# Patient Record
Sex: Female | Born: 1980 | Race: Black or African American | Hispanic: No | Marital: Single | Smoking: Never smoker
Health system: Southern US, Community
[De-identification: ages and names within clinical notes are randomized; demographics above are authoritative.]

## PROBLEM LIST (undated history)

## (undated) HISTORY — PX: TUBAL LIGATION: SHX77

---

## 2018-11-10 ENCOUNTER — Encounter (HOSPITAL_COMMUNITY): Payer: Self-pay | Admitting: Emergency Medicine

## 2018-11-10 ENCOUNTER — Emergency Department (HOSPITAL_COMMUNITY)
Admission: EM | Admit: 2018-11-10 | Discharge: 2018-11-11 | Disposition: A | Discharge status: Home / Self Care | Payer: BLUE CROSS/BLUE SHIELD | Attending: Emergency Medicine | Admitting: Emergency Medicine

## 2018-11-10 ENCOUNTER — Other Ambulatory Visit: Payer: Self-pay

## 2018-11-10 DIAGNOSIS — R102 Pelvic and perineal pain: Secondary | ICD-10-CM | POA: Diagnosis not present

## 2018-11-10 DIAGNOSIS — Z113 Encounter for screening for infections with a predominantly sexual mode of transmission: Secondary | ICD-10-CM | POA: Insufficient documentation

## 2018-11-10 DIAGNOSIS — N939 Abnormal uterine and vaginal bleeding, unspecified: Secondary | ICD-10-CM | POA: Insufficient documentation

## 2018-11-10 LAB — CBC WITH DIFFERENTIAL/PLATELET
Abs Immature Granulocytes: 0.01 10*3/uL (ref 0.00–0.07)
Basophils Absolute: 0 10*3/uL (ref 0.0–0.1)
Basophils Relative: 0 %
Eosinophils Absolute: 0.1 10*3/uL (ref 0.0–0.5)
Eosinophils Relative: 2 %
HCT: 35.6 % — ABNORMAL LOW (ref 36.0–46.0)
Hemoglobin: 11.2 g/dL — ABNORMAL LOW (ref 12.0–15.0)
Immature Granulocytes: 0 %
Lymphocytes Relative: 34 %
Lymphs Abs: 2.1 10*3/uL (ref 0.7–4.0)
MCH: 27.8 pg (ref 26.0–34.0)
MCHC: 31.5 g/dL (ref 30.0–36.0)
MCV: 88.3 fL (ref 80.0–100.0)
Monocytes Absolute: 0.4 10*3/uL (ref 0.1–1.0)
Monocytes Relative: 7 %
Neutro Abs: 3.5 10*3/uL (ref 1.7–7.7)
Neutrophils Relative %: 57 %
Platelets: 240 10*3/uL (ref 150–400)
RBC: 4.03 MIL/uL (ref 3.87–5.11)
RDW: 12.6 % (ref 11.5–15.5)
WBC: 6.1 10*3/uL (ref 4.0–10.5)
nRBC: 0 % (ref 0.0–0.2)

## 2018-11-10 LAB — COMPREHENSIVE METABOLIC PANEL
ALT: 11 U/L (ref 0–44)
AST: 14 U/L — ABNORMAL LOW (ref 15–41)
Albumin: 3.4 g/dL — ABNORMAL LOW (ref 3.5–5.0)
Alkaline Phosphatase: 52 U/L (ref 38–126)
Anion gap: 5 (ref 5–15)
BUN: 9 mg/dL (ref 6–20)
CO2: 25 mmol/L (ref 22–32)
Calcium: 8.6 mg/dL — ABNORMAL LOW (ref 8.9–10.3)
Chloride: 108 mmol/L (ref 98–111)
Creatinine, Ser: 0.78 mg/dL (ref 0.44–1.00)
GFR calc Af Amer: >60 mL/min (ref >60)
GFR calc non Af Amer: >60 mL/min (ref >60)
Glucose, Bld: 93 mg/dL (ref 70–99)
Potassium: 3.7 mmol/L (ref 3.5–5.1)
Sodium: 138 mmol/L (ref 135–145)
Total Bilirubin: 0.6 mg/dL (ref 0.3–1.2)
Total Protein: 7.2 g/dL (ref 6.5–8.1)

## 2018-11-10 LAB — I-STAT BETA HCG BLOOD, ED (MC, WL, AP ONLY): I-stat hCG, quantitative: <5 m[IU]/mL (ref <5)

## 2018-11-10 LAB — URINALYSIS, ROUTINE W REFLEX MICROSCOPIC
Bilirubin Urine: NEGATIVE
Glucose, UA: NEGATIVE mg/dL
Ketones, ur: NEGATIVE mg/dL
Leukocytes,Ua: NEGATIVE
Nitrite: NEGATIVE
Protein, ur: NEGATIVE mg/dL
Specific Gravity, Urine: 1.013 (ref 1.005–1.030)
pH: 6 (ref 5.0–8.0)

## 2018-11-10 NOTE — ED Triage Notes (Signed)
Patient reports vaginal spotting and low abdominal tenderness this morning , pt. added mid/low back pain yesterday , denies dysuria or fever .

## 2018-11-11 ENCOUNTER — Emergency Department (HOSPITAL_COMMUNITY): Payer: BLUE CROSS/BLUE SHIELD

## 2018-11-11 LAB — WET PREP, GENITAL
Sperm: NONE SEEN
Trich, Wet Prep: NONE SEEN
Yeast Wet Prep HPF POC: NONE SEEN

## 2018-11-11 MED ORDER — IBUPROFEN 800 MG PO TABS: 800.0000 mg | ORAL_TABLET | Freq: Once | ORAL | Status: DC

## 2018-11-11 NOTE — ED Provider Notes (Signed)
TIME SEEN: 2:01 AM  CHIEF COMPLAINT: Vaginal bleeding, lower abdominal pain  HPI: Patient is a 38 year old G6, P5 who presents to the emergency department with several days of lower back pain and then lower abdominal pain that started this morning with spotting that has now turned into increased vaginal bleeding.  States she passed something in the bathroom that she states look like "flesh".  She states she has had 1 previous miscarriage after a BTL.  She was concerned she could be pregnant today.  She is sexually active with one female partner for the past 10 months.  Does not use protection.  No history of STDs.  No abnormal vaginal discharge.  No dysuria or hematuria.  Denies fevers, chills, nausea, vomiting or diarrhea.  ROS: See HPI Constitutional: no fever  Eyes: no drainage  ENT: no runny nose   Cardiovascular:  no chest pain  Resp: no SOB  GI: no vomiting GU: no dysuria Integumentary: no rash  Allergy: no hives  Musculoskeletal: no leg swelling  Neurological: no slurred speech ROS otherwise negative  PAST MEDICAL HISTORY/PAST SURGICAL HISTORY:  History reviewed. No pertinent past medical history.  MEDICATIONS:  Prior to Admission medications   Not on File    ALLERGIES:  No Known Allergies  SOCIAL HISTORY:  Social History   Tobacco Use  . Smoking status: Never Smoker  . Smokeless tobacco: Never Used  Substance Use Topics  . Alcohol use: Never    Frequency: Never    FAMILY HISTORY: No family history on file.  EXAM: BP 116/87 (BP Location: Right Arm)   Pulse (!) 59   Temp 98.5 F (36.9 C) (Oral)   Resp 17   LMP 10/18/2018   SpO2 98%  CONSTITUTIONAL: Alert and oriented and responds appropriately to questions. Well-appearing; well-nourished HEAD: Normocephalic EYES: Conjunctivae clear, pupils appear equal, EOMI ENT: normal nose; moist mucous membranes NECK: Supple, no meningismus, no nuchal rigidity, no LAD  CARD: RRR; S1 and S2 appreciated; no murmurs, no  clicks, no rubs, no gallops RESP: Normal chest excursion without splinting or tachypnea; breath sounds clear and equal bilaterally; no wheezes, no rhonchi, no rales, no hypoxia or respiratory distress, speaking full sentences ABD/GI: Normal bowel sounds; non-distended; soft, tender diffusely throughout the pelvic region, no rebound, no guarding, no peritoneal signs, no hepatosplenomegaly GU:  Normal external genitalia. No lesions, rashes noted. Patient has small amount of dark red vaginal bleeding on exam. No vaginal discharge.  She has bilateral adnexal tenderness but no mass or fullness, no cervical motion tenderness. Cervix is not appear friable.  Cervix is closed.  Chaperone present for exam. BACK:  The back appears normal and is non-tender to palpation, there is no CVA tenderness EXT: Normal ROM in all joints; non-tender to palpation; no edema; normal capillary refill; no cyanosis, no calf tenderness or swelling    SKIN: Normal color for age and race; warm; no rash NEURO: Moves all extremities equally PSYCH: The patient's mood and manner are appropriate. Grooming and personal hygiene are appropriate.  MEDICAL DECISION MAKING: Patient here with pelvic pain.  Differential includes dysmenorrhea, endometriosis, TOA, torsion.  Pregnancy test is negative.  Urine shows no sign of infection.  Will check for STDs today.  Will obtain transvaginal ultrasound.  She states she would like ibuprofen for pain.  Doubt appendicitis, diverticulitis, colitis, bowel obstruction, perforation, cholecystitis, pancreatitis.  ED PROGRESS: Patient's wet prep shows clue cells but she is not having any discharge, itching or burning.  I do not feel  this needs to be treated at this time.  Otherwise wet prep is unremarkable.  Endovaginal ultrasound normal.  Normal blood flow to both ovaries.  No cyst, fibroids or other abnormality.  I suspect that her bleeding and discomfort are from her menstrual cycle starting a little bit  earlier this month than normal.  She agrees.  Recommended alternating Tylenol and Motrin.  Discussed return precautions.   At this time, I do not feel there is any life-threatening condition present. I have reviewed and discussed all results (EKG, imaging, lab, urine as appropriate) and exam findings with patient/family. I have reviewed nursing notes and appropriate previous records.  I feel the patient is safe to be discharged home without further emergent workup and can continue workup as an outpatient as needed. Discussed usual and customary return precautions. Patient/family verbalize understanding and are comfortable with this plan.  Outpatient follow-up has been provided as needed. All questions have been answered.      Dyshaun Bonzo, Delice Bison, DO 11/11/18 [id number]

## 2018-11-11 NOTE — ED Notes (Signed)
Patient transported to Ultrasound 

## 2018-11-11 NOTE — Discharge Instructions (Addendum)
You may alternate Tylenol 1000 mg every 6 hours as needed for pain and Ibuprofen 800 mg every 8 hours as needed for pain.  Please take Ibuprofen with food.  Please follow-up with your OB/GYN in [place] if symptoms continue.  Your labs, urine, ultrasound today were normal.  Gonorrhea and Chlamydia testing is pending.  If you are positive, you will be contacted.  You may follow-up on your results in my chart.

## 2018-11-14 LAB — GC/CHLAMYDIA PROBE AMP (~~LOC~~) NOT AT ARMC
Chlamydia: NEGATIVE
Neisseria Gonorrhea: NEGATIVE

## 2020-07-04 IMAGING — US ARTERIAL AND VENOUS ULTRASOUND OF THE ABDOMEN PELVIS AND SCROTUM
1 series · 13 of 25 positions shown · non-contrast
Comparison: None.

CLINICAL DATA: Abdominal pain.  Abnormal bleeding.

EXAM:
TRANSABDOMINAL AND TRANSVAGINAL ULTRASOUND OF PELVIS
DOPPLER ULTRASOUND OF OVARIES
TECHNIQUE: Both transabdominal and transvaginal ultrasound examinations of the
pelvis were performed. Transabdominal technique was performed for
global imaging of the pelvis including uterus, ovaries, adnexal
regions, and pelvic cul-de-sac.
It was necessary to proceed with endovaginal exam following the
transabdominal exam to visualize the ovaries. Color and duplex
Doppler ultrasound was utilized to evaluate blood flow to the
ovaries.

[Series 1: arterial and venous ultrasound of the abdomen pelv · 13 of 72 slices shown]
[im 1/72]
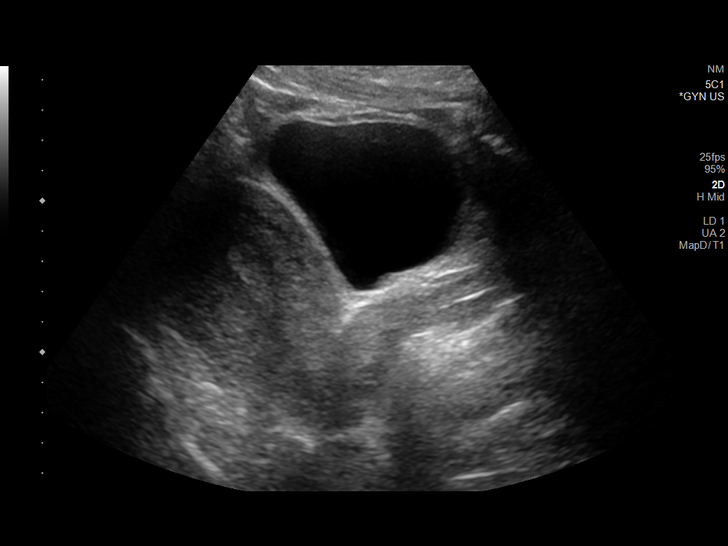
[im 6/72]
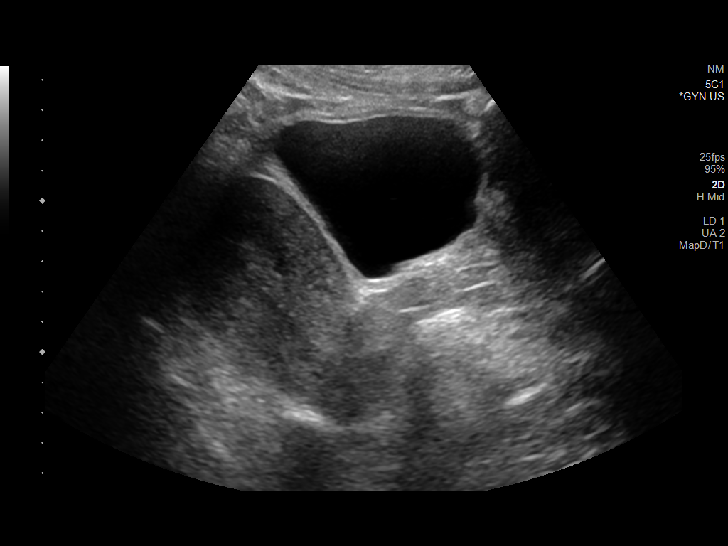
[im 12/72]
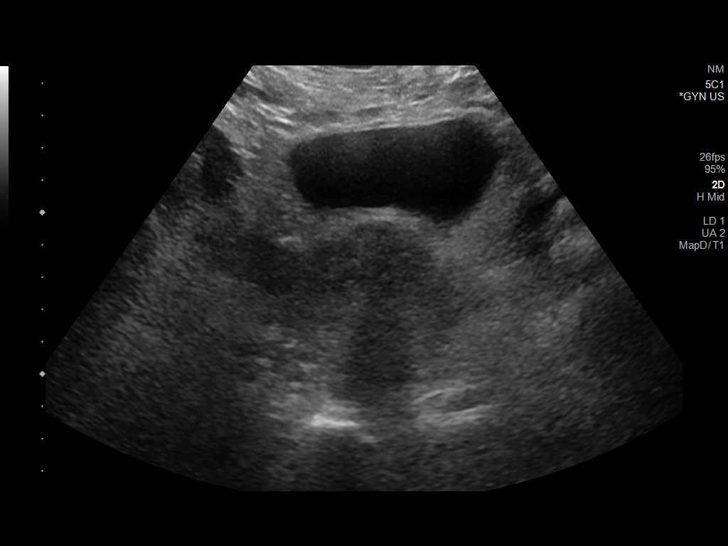
[im 18/72]
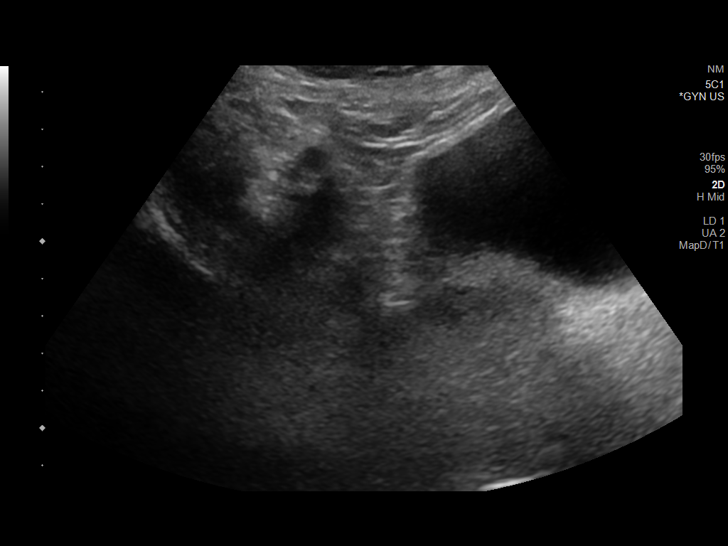
[im 24/72]
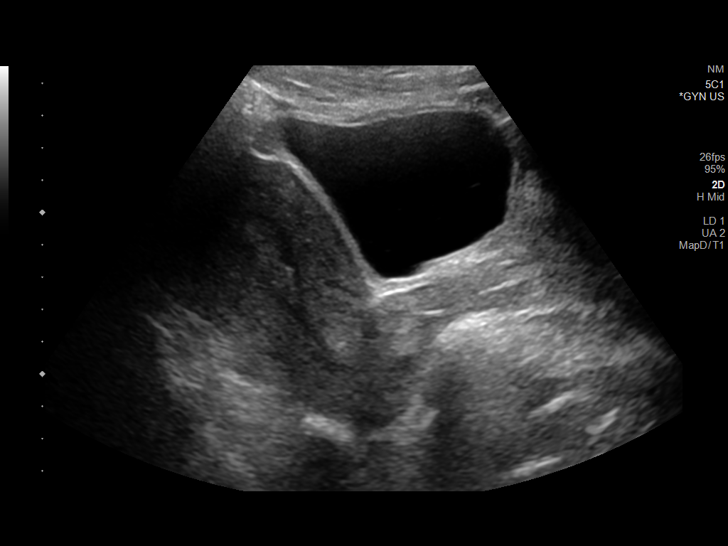
[im 30/72]
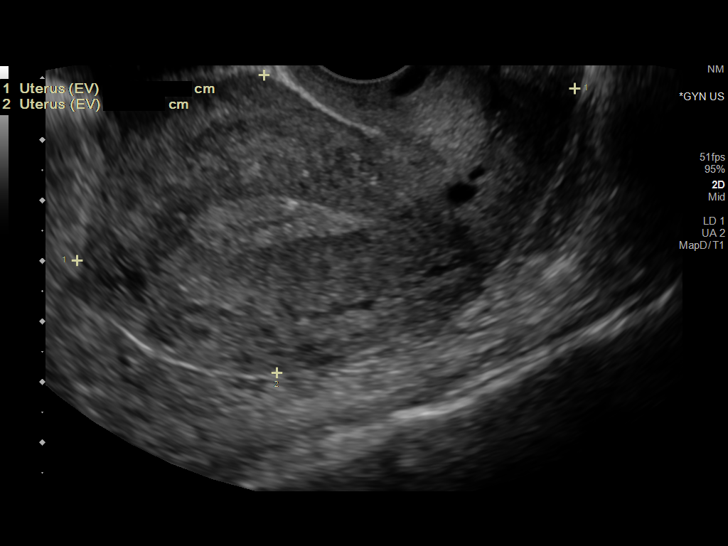
[im 36/72]
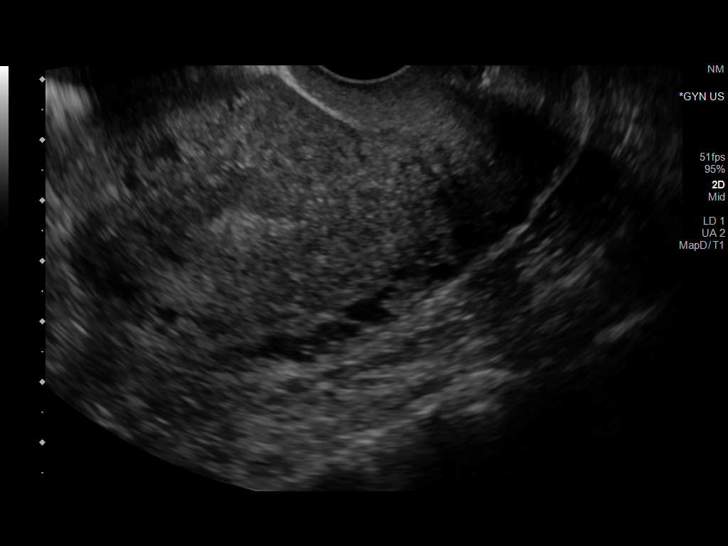
[im 42/72]
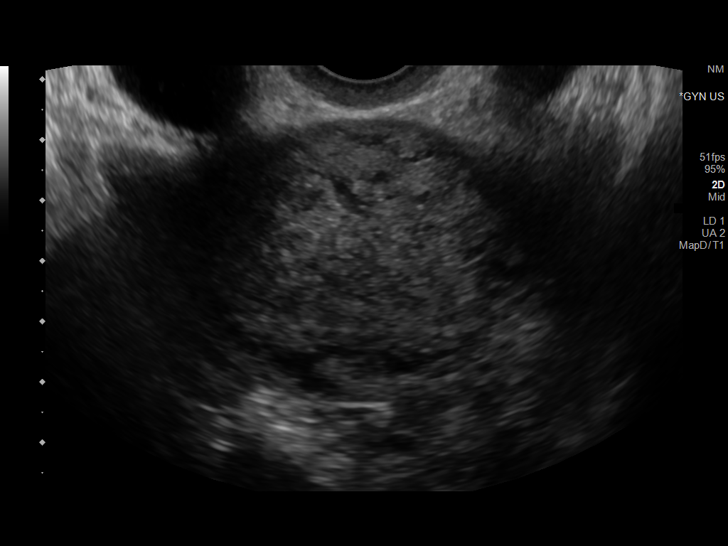
[im 48/72]
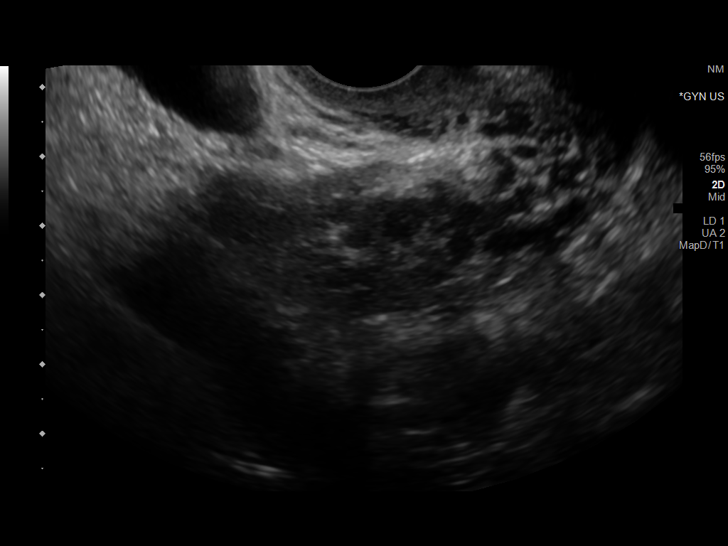
[im 54/72]
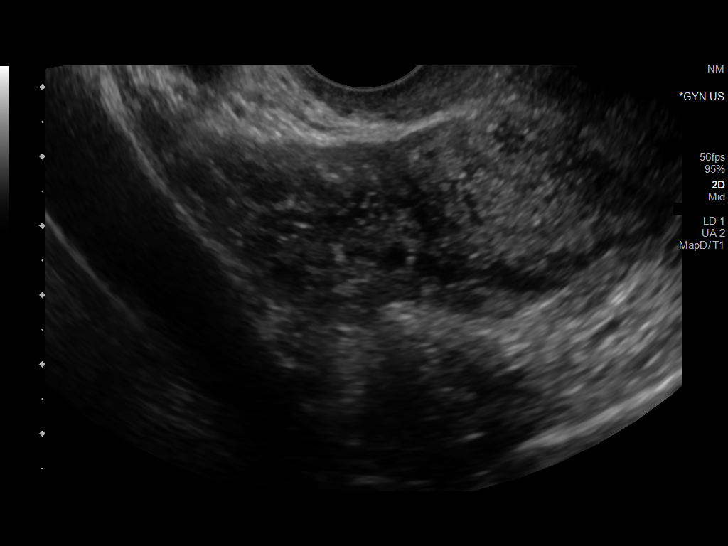
[im 60/72]
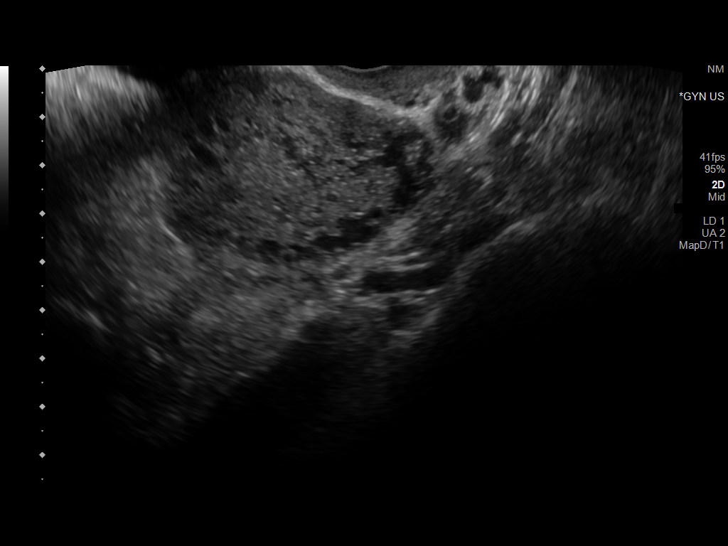
[im 66/72]
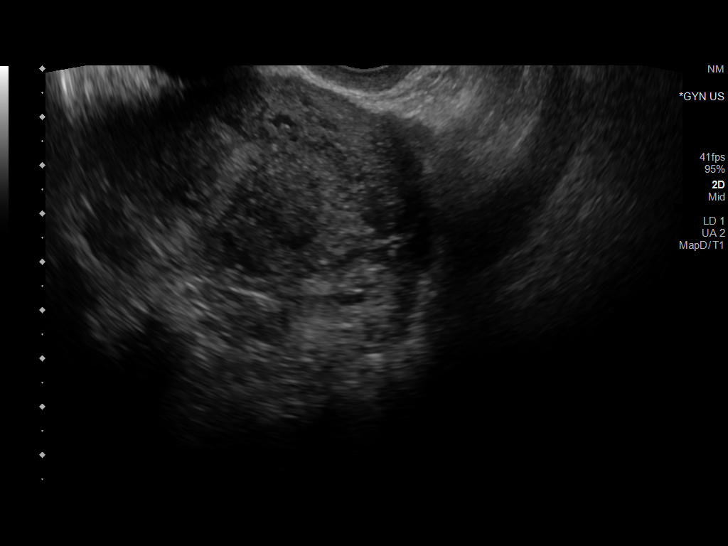
[im 72/72]
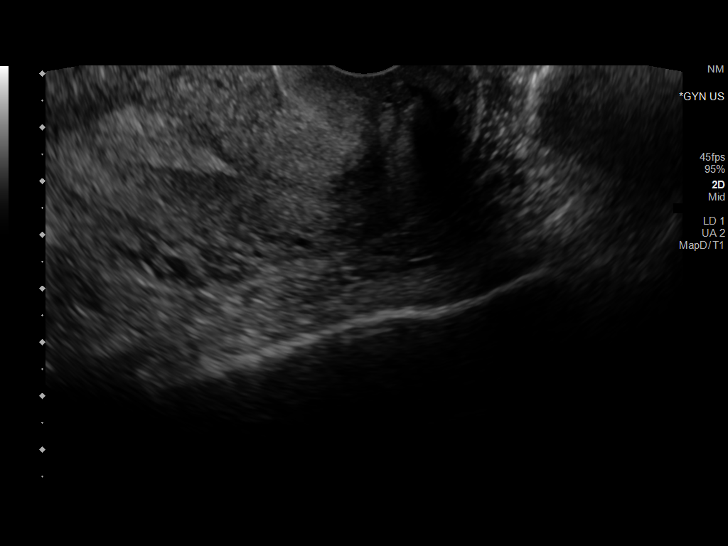

[13 of 25 positions shown; findings below may reference images not displayed]

FINDINGS: Uterus

Measurements: 8.7 x 5 x 5.4 cm = volume: 119 mL. No fibroids or
other mass visualized.

Endometrium

Thickness: 9 mm.  No focal abnormality visualized.

Right ovary

Measurements: 3 x 1.6 x 2.1 cm = volume: 5.1 mL. Normal
appearance/no adnexal mass.

Left ovary

Measurements: 3.9 x 1.8 x 2.6 cm = volume: 9.4 mL. Normal
appearance/no adnexal mass.

Pulsed Doppler evaluation of both ovaries demonstrates normal
low-resistance arterial and venous waveforms.

Other findings

No abnormal free fluid.
IMPRESSION: Normal study.
